# Patient Record
Sex: Female | Born: 1955 | Race: White | Hispanic: No | Marital: Married | Smoking: Never smoker
Health system: Southern US, Community
[De-identification: ages and names within clinical notes are randomized; demographics above are authoritative.]

## PROBLEM LIST (undated history)

## (undated) DIAGNOSIS — I10 Essential (primary) hypertension: Secondary | ICD-10-CM

## (undated) DIAGNOSIS — J449 Chronic obstructive pulmonary disease, unspecified: Secondary | ICD-10-CM

## (undated) HISTORY — PX: BREAST EXCISIONAL BIOPSY: SUR124

---

## 2004-09-05 ENCOUNTER — Ambulatory Visit: Payer: Self-pay | Admitting: Family Medicine

## 2005-04-01 ENCOUNTER — Ambulatory Visit: Payer: Self-pay

## 2008-08-28 ENCOUNTER — Ambulatory Visit: Payer: Self-pay | Admitting: Family Medicine

## 2008-10-25 ENCOUNTER — Ambulatory Visit: Payer: Self-pay

## 2009-07-28 ENCOUNTER — Emergency Department: Payer: Self-pay | Admitting: Unknown Physician Specialty

## 2011-11-25 HISTORY — PX: MENISCUS REPAIR: SHX5179

## 2012-11-24 HISTORY — PX: SHOULDER SURGERY: SHX246

## 2015-05-16 ENCOUNTER — Ambulatory Visit: Payer: Self-pay

## 2015-07-18 ENCOUNTER — Ambulatory Visit: Payer: Self-pay | Attending: Oncology

## 2015-11-14 ENCOUNTER — Ambulatory Visit: Payer: Self-pay

## 2015-11-21 ENCOUNTER — Ambulatory Visit
Admission: RE | Admit: 2015-11-21 | Discharge: 2015-11-21 | Disposition: A | Discharge status: Home / Self Care | Payer: Self-pay | Source: Ambulatory Visit | Attending: Oncology | Admitting: Oncology

## 2015-11-21 ENCOUNTER — Encounter: Payer: Self-pay | Admitting: *Deleted

## 2015-11-21 ENCOUNTER — Ambulatory Visit: Payer: Self-pay | Attending: Oncology | Admitting: *Deleted

## 2015-11-21 VITALS — BP 121/80 | HR 80 | Temp 97.8°F | Resp 18 | Ht 64.57 in | Wt 204.4 lb

## 2015-11-21 DIAGNOSIS — Z0000 Encounter for general adult medical examination without abnormal findings: Secondary | ICD-10-CM

## 2015-11-21 NOTE — Progress Notes (Signed)
Subjective:     Patient ID: Cindy Scott, female   DOB: 08-26-1956, 59 y.o.   MRN: [id number]  HPI   Review of Systems     Objective:   Physical Exam  Pulmonary/Chest: Right breast exhibits no inverted nipple, no mass, no nipple discharge, no skin change and no tenderness. Left breast exhibits no inverted nipple, no mass, no nipple discharge, no skin change and no tenderness. Breasts are symmetrical.  Abdominal: There is no splenomegaly or hepatomegaly.    Scar from c-section  Genitourinary: There is no rash, tenderness, lesion or injury on the right labia. There is no rash, tenderness, lesion or injury on the left labia. Cervix exhibits no motion tenderness, no discharge and no friability. Right adnexum displays no mass, no tenderness and no fullness. Left adnexum displays no mass, no tenderness and no fullness. No erythema, tenderness or bleeding in the vagina. No foreign body around the vagina. No signs of injury around the vagina. No vaginal discharge found.         Assessment:     59 year old White [facility], pap smear and mammogram.  Clinical breast exam unremarkable.  Taught self breast awareness.  Specimen collected for pap smear.  There is noted a very large red greater than 2 cm cervical polyp.  Patient refused rectal exam.  States she has had diarrhea and doesn't feel she could tolerated it.  Patient has been screened for eligibility.  She does not have any insurance, Medicare or Medicaid.  She also meets financial eligibility.  Hand-out given on the Affordable Care Act.     Plan:     Screening mammogram ordered.  Specimen sent to the lab.  Appointment scheduled to see Dr. Greggory Keenefrancesco for further evaluation of the cervical polyp on 12/20/15 @ 9:00.  Patient wants to wait, since she has knee surgery planned for 11/30/15.  Will follow-up per protocol.

## 2015-11-24 LAB — PAP LB AND HPV HIGH-RISK
HPV, HIGH-RISK: POSITIVE — AB
PAP Smear Comment: 0

## 2015-11-27 ENCOUNTER — Encounter: Payer: Self-pay | Admitting: *Deleted

## 2015-11-27 NOTE — Patient Instructions (Signed)
Human Papillomavirus Human papillomavirus (HPV) is the most common sexually transmitted infection (STI) and is highly contagious. HPV infections cause genital warts and cancers to the outlet of the womb (cervix), birth canal (vagina), opening of the birth canal (vulva), and anus. There are over 100 types of HPV. Unless wartlike lesions are present in the throat or there are genital warts that you can see or feel, HPV usually does not cause symptoms. It is possible to be infected for long periods and pass it on to others without knowing it. CAUSES  HPV is spread from person to person through sexual contact. This includes oral, vaginal, or anal sex. RISK FACTORS  Having unprotected sex. HPV can be spread by oral, vaginal, or anal sex.  Having several sex partners.  Having a sex partner who has other sex partners.  Having or having had another sexually transmitted infection. SIGNS AND SYMPTOMS  Most people carrying HPV do not have any symptoms. If symptoms are present, symptoms may include:  Wartlike lesions in the throat (from having oral sex).  Warts in the infected skin or mucous membranes.  Genital warts that may itch, burn, or bleed.  Genital warts that may be painful or bleed during sexual intercourse. DIAGNOSIS  If wartlike lesions are present in the throat or genital warts are present, your health care provider can usually diagnose HPV by physical examination.   Genital warts are easily seen with the naked eye.  Currently, there is no FDA-approved test to detect HPV in males.  In females, a Pap test can show cells that are infected with HPV.  In females, a scope can be used to view the cervix (colposcopy). A colposcopy can be performed if the pelvic exam or Pap test is abnormal. A sample of tissue may be removed (biopsy) during the colposcopy. TREATMENT  There is no treatment for the virus itself. However, there are treatments for the health problems and symptoms HPV can cause.  Your health care provider will follow you closely after you are treated. This is because the HPV can come back and may need treatment again. Treatment of HPV may include:   Medicines, which may be injected or applied in a cream, lotion, or gel form.  Use of a probe to apply extreme cold (cryotherapy).  Application of an intense beam of light (laser treatment).  Use of a probe to apply extreme heat (electrocautery).  Surgery. HOME CARE INSTRUCTIONS   Take medicines only as directed by your health care provider.  Use over-the-counter creams for itching or irritation as directed by your health care provider.  Keep all follow-up visits as directed by your health care provider. This is important.  Do not touch or scratch the warts.  Do not treat genital warts with medicines used for treating hand warts.  Do not have sex while you are being treated.  Do not douche or use tampons during treatment of HPV.  Tell your sex partner about your infection because he or she may also need treatment.  If you become pregnant, tell your health care provider that you have had HPV. Your health care provider will monitor you closely during pregnancy to be sure your baby is safe.  After treatment, use condoms during sex to prevent future infections.  Have only one sex partner.  Have a sex partner who does not have other sex partners. PREVENTION   Talk to your health care provider about getting the HPV vaccines. These vaccines prevent some HPV infections and cancers.   It is recommended that the vaccine be given to males and females between the ages of 9 and 26 years old. It will not work if you already have HPV, and it is not recommended for pregnant women.  A Pap test is done to screen for cervical cancer in women.  The first Pap test should be done at age 21 years.  Between ages 21 and 29 years, Pap tests are repeated every 2 years.  Beginning at age 30, you are advised to have a Pap test every  3 years as long as your past 3 Pap tests have been normal.  Some women have medical problems that increase the chance of getting cervical cancer. Talk to your health care provider about these problems. It is especially important to talk to your health care provider if a new problem develops soon after your last Pap test. In these cases, your health care provider may recommend more frequent screening and Pap tests.  The above recommendations are the same for women who have or have not gotten the vaccine for HPV.  If you had a hysterectomy for a problem that was not a cancer or a condition that could lead to cancer, then you no longer need Pap tests. However, even if you no longer need a Pap test, a regular exam is a good idea to make sure no other problems are starting.   If you are between the ages of 65 and 70 years and you have had normal Pap tests going back 10 years, you no longer need Pap tests. However, even if you no longer need a Pap test, a regular exam is a good idea to make sure no other problems are starting.  If you have had past treatment for cervical cancer or a condition that could lead to cancer, you need Pap tests and screening for cancer for at least 20 years after your treatment.  If Pap tests have been discontinued, risk factors (such as a new sexual partner)need to be reassessed to determine if screening should be resumed.  Some women may need screenings more often if they are at high risk for cervical cancer. SEEK MEDICAL CARE IF:   The treated skin becomes red, swollen, or painful.  You have a fever.  You feel generally ill.  You feel lumps or pimple-like projections in and around your genital area.  You develop bleeding of the vagina or the treatment area.  You have painful sexual intercourse. MAKE SURE YOU:   Understand these instructions.  Will watch your condition.  Will get help if you are not doing well or get worse.   This information is not  intended to replace advice given to you by your health care provider. Make sure you discuss any questions you have with your health care provider.   Document Released: 01/31/2004 Document Revised: 12/01/2014 Document Reviewed: 02/15/2014 Elsevier Interactive Patient Education 2016 Elsevier Inc.  

## 2015-12-20 ENCOUNTER — Encounter: Payer: Self-pay | Admitting: Obstetrics and Gynecology

## 2015-12-25 ENCOUNTER — Encounter: Payer: Self-pay | Admitting: *Deleted

## 2015-12-25 NOTE — Progress Notes (Signed)
Letter mailed to patient 11/27/15.  Follow up in one year with annual mammogram and next pap.  [facility] to [place].

## 2016-02-14 ENCOUNTER — Encounter: Payer: Self-pay | Admitting: Obstetrics and Gynecology

## 2016-03-18 ENCOUNTER — Encounter: Payer: Self-pay | Admitting: Obstetrics and Gynecology

## 2016-10-14 ENCOUNTER — Other Ambulatory Visit: Payer: Self-pay | Admitting: Specialist

## 2016-10-14 DIAGNOSIS — Z1231 Encounter for screening mammogram for malignant neoplasm of breast: Secondary | ICD-10-CM

## 2016-12-03 ENCOUNTER — Ambulatory Visit: Payer: Self-pay

## 2016-12-15 ENCOUNTER — Ambulatory Visit: Payer: Self-pay

## 2017-02-04 ENCOUNTER — Ambulatory Visit: Payer: Self-pay

## 2017-02-11 ENCOUNTER — Ambulatory Visit
Admission: RE | Admit: 2017-02-11 | Discharge: 2017-02-11 | Disposition: A | Discharge status: Home / Self Care | Payer: Self-pay | Source: Ambulatory Visit | Attending: Oncology | Admitting: Oncology

## 2017-02-11 ENCOUNTER — Ambulatory Visit: Payer: Self-pay | Attending: Oncology

## 2017-02-11 VITALS — BP 116/74 | HR 57 | Temp 98.4°F | Ht 63.0 in | Wt 185.6 lb

## 2017-02-11 DIAGNOSIS — Z0000 Encounter for general adult medical examination without abnormal findings: Secondary | ICD-10-CM

## 2017-02-11 NOTE — Progress Notes (Signed)
Subjective:     Patient ID: Cindy Scott, female   DOB: September 23, 1956, 61 y.o.   MRN: [id number]  HPI   Review of Systems     Objective:   Physical Exam  Pulmonary/Chest: Right breast exhibits no inverted nipple, no mass, no nipple discharge, no skin change and no tenderness. Left breast exhibits no inverted nipple, no mass, no nipple discharge, no skin change and no tenderness. Breasts are symmetrical.       Assessment:     61 year old patient [facility]  Patient screened, and meets [facility] eligibility.  Patient does not have insurance, Medicare or Medicaid.  Handout given on Affordable Care Act.  Instructed patient on breast self-exam using teach back method.  CBE unremarkable.  No mass or lump palpated.  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 She was referred to [facility] for cervical polyp that she states has been present greater than 10 years, but she cancelled her appointment with Dr. Greggory Keenefrancesco. States [facility], and wants to have pap/polyp removal at that time if possible due to anxiety.  Dr. Sonia SideSecord offered to see patient on 03/04/17 to evaluate polyp.     Plan:     Sent for bilateral screening mammogram.  Patient to call tomorrow if she wants to be scheduled for appointment polyp evaluation by Dr. Sonia SideSecord on 03/04/17.

## 2017-02-12 ENCOUNTER — Other Ambulatory Visit: Payer: Self-pay

## 2017-02-12 DIAGNOSIS — N63 Unspecified lump in unspecified breast: Secondary | ICD-10-CM

## 2017-02-12 NOTE — Progress Notes (Signed)
Patient called this morning, and declined appointment with Dr. Sonia SideSecord for 03/04/17.  States she will call back if needs to schedule future appointment for pap, and cervical polyp evaluation. Orders in for additional views.

## 2017-02-25 ENCOUNTER — Ambulatory Visit: Payer: Self-pay

## 2017-03-02 ENCOUNTER — Other Ambulatory Visit: Payer: Self-pay

## 2017-03-02 ENCOUNTER — Ambulatory Visit: Payer: Self-pay

## 2017-03-05 ENCOUNTER — Ambulatory Visit
Admission: RE | Admit: 2017-03-05 | Discharge: 2017-03-05 | Disposition: A | Discharge status: Home / Self Care | Payer: Self-pay | Source: Ambulatory Visit | Attending: Oncology | Admitting: Oncology

## 2017-03-05 DIAGNOSIS — N63 Unspecified lump in unspecified breast: Secondary | ICD-10-CM

## 2017-03-05 NOTE — Progress Notes (Signed)
Letter mailed from [facility] to notify of normal mammogram results.  Patient to return in one year for annual screening.  Copy to [facility]IS. 

## 2017-04-08 ENCOUNTER — Inpatient Hospital Stay: Payer: Self-pay

## 2017-04-22 ENCOUNTER — Telehealth: Payer: Self-pay

## 2017-04-22 ENCOUNTER — Inpatient Hospital Stay: Payer: Self-pay | Attending: Obstetrics and Gynecology

## 2017-04-22 NOTE — Telephone Encounter (Signed)
  Oncology Nurse Navigator Documentation Voicemail left with Ms. Adan. Notified that she can come to the clinic today even though she has missed her appointment time. Navigator Location: [facility] (04/22/17 0900)   )Navigator Encounter Type: Telephone (04/22/17 0900) Telephone: Kathrin Pennerutgoing Call;Appt Confirmation/Clarification (04/22/17 0900)                                                  Time Spent with Patient: 15 (04/22/17 0900)

## 2018-08-18 ENCOUNTER — Encounter: Payer: Self-pay | Admitting: *Deleted

## 2018-08-18 ENCOUNTER — Ambulatory Visit
Admission: RE | Admit: 2018-08-18 | Discharge: 2018-08-18 | Disposition: A | Discharge status: Home / Self Care | Payer: Self-pay | Source: Ambulatory Visit | Attending: Oncology | Admitting: Oncology

## 2018-08-18 ENCOUNTER — Other Ambulatory Visit: Payer: Self-pay | Admitting: Oncology

## 2018-08-18 ENCOUNTER — Encounter (INDEPENDENT_AMBULATORY_CARE_PROVIDER_SITE_OTHER): Payer: Self-pay

## 2018-08-18 ENCOUNTER — Other Ambulatory Visit: Payer: Self-pay

## 2018-08-18 ENCOUNTER — Ambulatory Visit: Payer: Self-pay | Attending: Oncology | Admitting: *Deleted

## 2018-08-18 VITALS — BP 107/75 | HR 69 | Temp 98.3°F | Ht 64.0 in | Wt 194.0 lb

## 2018-08-18 DIAGNOSIS — Z0000 Encounter for general adult medical examination without abnormal findings: Secondary | ICD-10-CM

## 2018-08-18 NOTE — Progress Notes (Signed)
  Subjective:     Patient ID: Cindy Scott, female   DOB: 1956/05/13, 62 y.o.   MRN: [id number]  HPI   Review of Systems     Objective:   Physical Exam  Pulmonary/Chest: Right breast exhibits no inverted nipple, no mass, no nipple discharge, no skin change and no tenderness. Left breast exhibits no inverted nipple, no mass, no nipple discharge, no skin change and no tenderness.  Abdominal: There is no splenomegaly or hepatomegaly.  Genitourinary: No labial fusion. There is no rash, tenderness, lesion or injury on the right labia. There is no rash, tenderness, lesion or injury on the left labia.         Assessment:     62 year old White female returns to [facility] for annual screening.  Clinical breast exam unremarkable.  Taught self breast awareness.  Specimen collected for pap smear without difficulty.  Very large approximate 2.5 cm cervical polyp noted on exam.  This polyp was noted on her 2016 exam and her 2018 exam, but patient did not follow-up with referral to GYN.  Discussed need to follow up with GYN again today.  Patient states she is agreeable.  Patient has been screened for eligibility.  She does not have any insurance, Medicare or Medicaid.  She also meets financial eligibility.  Hand-out given on the Affordable Care Act. Risk Assessment    Risk Scores      08/18/2018   Last edited by: Scarlett Presto, RN   5-year risk: 3.7 %   Lifetime risk: 16.8 %            Plan:     Screening mammogram ordered.  Specimen for pap sent to the lab.  Joellyn Quails to schedule patient a GYN appointment for further evaluation of the cervical polyp.  [place]  Christy to schedule appointment for at least 2 weeks out for pap results to be available and ease of getting patient back.  Will follow-up per [facility] protocol.

## 2018-08-25 LAB — PAP LB AND HPV HIGH-RISK
HPV, HIGH-RISK: NEGATIVE
PAP Smear Comment: 0

## 2018-08-30 ENCOUNTER — Encounter: Payer: Self-pay | Admitting: *Deleted

## 2018-08-30 NOTE — Progress Notes (Signed)
Left patient a message to return my call.  I have the results of her mammogram and pap smear.

## 2018-09-01 ENCOUNTER — Encounter: Payer: Self-pay | Admitting: Obstetrics and Gynecology

## 2018-09-01 ENCOUNTER — Telehealth: Payer: Self-pay | Admitting: Obstetrics and Gynecology

## 2018-09-01 ENCOUNTER — Ambulatory Visit (INDEPENDENT_AMBULATORY_CARE_PROVIDER_SITE_OTHER): Payer: PRIVATE HEALTH INSURANCE | Admitting: Obstetrics and Gynecology

## 2018-09-01 ENCOUNTER — Other Ambulatory Visit (HOSPITAL_COMMUNITY)
Admission: RE | Admit: 2018-09-01 | Discharge: 2018-09-01 | Disposition: A | Discharge status: Home / Self Care | Payer: Self-pay | Source: Ambulatory Visit | Attending: Obstetrics and Gynecology | Admitting: Obstetrics and Gynecology

## 2018-09-01 VITALS — BP 130/82 | HR 52 | Ht 64.0 in | Wt 192.0 lb

## 2018-09-01 DIAGNOSIS — N841 Polyp of cervix uteri: Secondary | ICD-10-CM

## 2018-09-01 NOTE — Progress Notes (Signed)
Pt presents today with cervical polyp. Pt is doing well.

## 2018-09-01 NOTE — Progress Notes (Signed)
HPI:      [name] is a 62 y.o. No obstetric history on file. who LMP was Patient's last menstrual period was 11/25/2004.  Subjective:   She presents today referred from [facility] for possible endocervical polyp.  Denies any issues with vaginal bleeding.  She states that it has been noticed and been there for years but has been "afraid to do anything about it".    Hx: The following portions of the patient's history were reviewed and updated as appropriate:             She  has no past medical history on file. She does not have a problem list on file. She  has a past surgical history that includes Breast excisional biopsy (Right, 45+ yrs ago). Her family history includes Breast cancer (age of onset: 22) in her mother. She  has no tobacco, alcohol, and drug history on file. She has a current medication list which includes the following prescription(s): esomeprazole, fluoxetine, gabapentin, pramipexole, and tiotropium. She has No Known Allergies.       Review of Systems:  Review of Systems  Constitutional: Denied constitutional symptoms, night sweats, recent illness, fatigue, fever, insomnia and weight loss.  Eyes: Denied eye symptoms, eye pain, photophobia, vision change and visual disturbance.  Ears/Nose/Throat/Neck: Denied ear, nose, throat or neck symptoms, hearing loss, nasal discharge, sinus congestion and sore throat.  Cardiovascular: Denied cardiovascular symptoms, arrhythmia, chest pain/pressure, edema, exercise intolerance, orthopnea and palpitations.  Respiratory: Denied pulmonary symptoms, asthma, pleuritic pain, productive sputum, cough, dyspnea and wheezing.  Gastrointestinal: Denied, gastro-esophageal reflux, melena, nausea and vomiting.  Genitourinary: See HPI for additional information.  Musculoskeletal: Denied musculoskeletal symptoms, stiffness, swelling, muscle weakness and myalgia.  Dermatologic: Denied dermatology symptoms, rash and scar.  Neurologic: Denied  neurology symptoms, dizziness, headache, neck pain and syncope.  Psychiatric: Denied psychiatric symptoms, anxiety and depression.  Endocrine: Denied endocrine symptoms including hot flashes and night sweats.   Meds:   Current Outpatient Medications on File Prior to Visit  Medication Sig Dispense Refill  . esomeprazole (NEXIUM) 10 MG packet Take 10 mg by mouth daily before breakfast.    . FLUoxetine (PROZAC) 40 MG capsule Take 40 mg by mouth 2 (two) times daily.    Marland Kitchen gabapentin (NEURONTIN) 400 MG capsule Take 400 mg by mouth 2 (two) times daily.    . pramipexole (MIRAPEX) 0.25 MG tablet Take 0.25 mg by mouth 3 (three) times daily.    Marland Kitchen tiotropium (SPIRIVA) 18 MCG inhalation capsule Place 18 mcg into inhaler and inhale daily.     No current facility-administered medications on file prior to visit.     Objective:     Vitals:   09/01/18 1104  BP: 130/82  Pulse: (!) 52              Physical examination   Pelvic:   Vulva: Normal appearance.  No lesions.  Vagina: No lesions or abnormalities noted.  Support: Normal pelvic support.  Urethra No masses tenderness or scarring.  Meatus Normal size without lesions or prolapse.  Cervix: Normal appearance.  No lesions.  Polyp noted  Anus: Normal exam.  No lesions.  Perineum: Normal exam.  No lesions.        Bimanual   Uterus: Normal size.  Non-tender.  Mobile.  AV.  Adnexae: No masses.  Non-tender to palpation.  Cul-de-sac: Negative for abnormality.   Endocervical polypectomy Endocervical polyp present.  Polypectomy performed without problem.  Hemostatis noted or obtained with [facility]'  solution.    Assessment:    No obstetric history on file. There are no active problems to display for this patient.    1. Endocervical polyp     Likely benign   Plan:            1.  Polyp removed and sent to pathology.  Expect benign results.  Patient to follow-up at [facility] for routine care.  If there are any issues with the polyp we will  contact her regarding follow-up here.  Orders No orders of the defined types were placed in this encounter.   No orders of the defined types were placed in this encounter.     F/U  No follow-ups on file. I spent 22 minutes involved in the care of this patient of which greater than 50% was spent discussing medical condition, previous Pap smears, vaginal bleeding, endocervical polyp causes and management, future follow-ups.  All questions answered.  Elonda Husky, M.D. 09/01/2018 11:13 AM

## 2018-09-01 NOTE — Addendum Note (Signed)
Addended by: Girard Cooter M on: 09/01/2018 11:56 AM   Modules accepted: Orders

## 2018-09-01 NOTE — Telephone Encounter (Signed)
The patient is asking if the cream script is going to be sent to [facility] today for the rash or does she need to come get samples?  She is on line 1 now, but she can be reached at her cell number as well, please advise, thanks

## 2018-09-02 NOTE — Telephone Encounter (Signed)
Advised pt that she could get antifungal cream over the counter. Pt understood.

## 2018-09-08 ENCOUNTER — Encounter: Payer: Self-pay | Admitting: Obstetrics and Gynecology

## 2018-09-21 ENCOUNTER — Encounter: Payer: Self-pay | Admitting: *Deleted

## 2018-09-21 NOTE — Progress Notes (Signed)
Letter mailed to inform patient of her normal mammogram and pap results.  She is to follow up in 1 year with annual screening mammo and pap in 3 years.  [facility] to [place].

## 2019-08-19 ENCOUNTER — Telehealth: Payer: Self-pay

## 2019-08-19 NOTE — Telephone Encounter (Signed)
Pre-visit screening call made prior to appointment on 08/23/2019 with [facility]. Pt would like to reschedule this appointment. Care team notified.

## 2019-08-22 ENCOUNTER — Telehealth: Payer: Self-pay | Admitting: *Deleted

## 2019-08-23 ENCOUNTER — Ambulatory Visit: Payer: Self-pay

## 2020-01-31 ENCOUNTER — Ambulatory Visit: Payer: Self-pay | Attending: Internal Medicine

## 2020-01-31 DIAGNOSIS — Z23 Encounter for immunization: Secondary | ICD-10-CM | POA: Insufficient documentation

## 2020-01-31 NOTE — Progress Notes (Signed)
   Covid-19 Vaccination Clinic  Name:  Cindy Scott    MRN: [id number] DOB: 09/07/1956  01/31/2020  Cindy Scott was observed post Covid-19 immunization for 15 minutes without incident. She was provided with Vaccine Information Sheet and instruction to access the V-Safe system.   Cindy Scott was instructed to call 911 with any severe reactions post vaccine: Marland Kitchen Difficulty breathing  . Swelling of face and throat  . A fast heartbeat  . A bad rash all over body  . Dizziness and weakness   Immunizations Administered    Name Date Dose VIS Date Route   Pfizer COVID-19 Vaccine 01/31/2020  4:46 PM 0.3 mL 11/04/2019 Intramuscular   Manufacturer: [employer], [employer]   Lot: [id number]   NDC: [address]-7

## 2020-02-21 ENCOUNTER — Ambulatory Visit: Payer: Self-pay | Attending: Internal Medicine

## 2020-02-21 DIAGNOSIS — Z23 Encounter for immunization: Secondary | ICD-10-CM

## 2020-02-21 NOTE — Progress Notes (Signed)
   Covid-19 Vaccination Clinic  Name:  Cindy Scott    MRN: [id number] DOB: 12-17-1955  02/21/2020  [name] was observed post Covid-19 immunization for 15 minutes without incident. She was provided with Vaccine Information Sheet and instruction to access the V-Safe system.   [name] was instructed to call 911 with any severe reactions post vaccine: Marland Kitchen Difficulty breathing  . Swelling of face and throat  . A fast heartbeat  . A bad rash all over body  . Dizziness and weakness   Immunizations Administered    Name Date Dose VIS Date Route   Pfizer COVID-19 Vaccine 02/21/2020  1:22 PM 0.3 mL 11/04/2019 Intramuscular   Manufacturer: [employer], [employer]   Lot: [id number]   NDC: [address]-5

## 2020-05-15 ENCOUNTER — Ambulatory Visit
Admission: RE | Admit: 2020-05-15 | Discharge: 2020-05-15 | Disposition: A | Discharge status: Home / Self Care | Payer: Medicaid Other | Source: Ambulatory Visit | Attending: Oncology | Admitting: Oncology

## 2020-05-15 ENCOUNTER — Ambulatory Visit: Payer: Self-pay | Attending: Oncology | Admitting: *Deleted

## 2020-05-15 ENCOUNTER — Other Ambulatory Visit: Payer: Self-pay

## 2020-05-15 ENCOUNTER — Encounter: Payer: Self-pay | Admitting: *Deleted

## 2020-05-15 VITALS — BP 138/83 | HR 73 | Temp 97.3°F | Resp 20 | Ht 63.0 in | Wt 215.5 lb

## 2020-05-15 DIAGNOSIS — Z0000 Encounter for general adult medical examination without abnormal findings: Secondary | ICD-10-CM

## 2020-05-15 DIAGNOSIS — Z1231 Encounter for screening mammogram for malignant neoplasm of breast: Secondary | ICD-10-CM | POA: Insufficient documentation

## 2020-05-15 NOTE — Patient Instructions (Signed)
Gave patient hand-out, Women Staying Healthy, Active and Well from [place], with education on breast health, pap smears, heart and colon health. HPV Test Why am I having this test? HPV (human papillomavirus) refers to a group of about 100 viruses. Many of these viruses cause growths on, in, or around the genitals. Most HPV viruses cause infections that usually go away without treatment.  The HPV test checks for high-risk types (strains) of HPV. Strains 16 and 18 are considered the most high-risk for cancer. If you have strain 16 or 18 HPV and it is not treated, it can increase your risk for cancer of the cervix, vagina, vulva, or anus. HPV can be found in both males and females. However, the HPV test is used to screen for increased cancer risk in females:  Who are 22-40 years old.  Who have an abnormal Pap test.  Who have been treated for an abnormal Pap test in the past.  Who have been treated for a high-risk HPV infection in the past. If you are a woman older than 30, you may have the HPV test at the same time as a pelvic exam and Pap test. What is being tested? This test checks for the DNA (genetic) strands of the HPV infection. This test is also called the HPV DNA test. What kind of sample is taken?  This test requires a sample of cells from the cervix. This will be done using a small cotton swab, plastic spatula, or brush. This sample is often collected during a pelvic exam, when you are lying on your back on an exam table with feet in footrests (stirrups). How do I prepare for this test?  Starting 24-48 hours before your test, or as told by your health care provider, do not: ? Take a bath. ? Have sex. ? Douche.  Schedule the test for a day when you are not menstruating. If you are menstruating on the day of the test, you may need to reschedule.  You will be asked to urinate right before the test. How are the results reported? Your test results will be reported as either positive or  negative for HPV. If you have a positive result, the results will indicate which HPV strain you are positive for. What do the results mean? A negative HPV test result means that no HPV was found. This means it is very likely that you do not have HPV. A positive HPV test result means that you have HPV.  If your results show the presence of any high-risk strains, you may have a higher risk of developing anal or cervical cancer if your infection is not treated.  If any low-risk HPV strains are found, it is not likely that you have an increased risk for cancer. Talk with your health care provider about what your results mean. Questions to ask your health care provider Ask your health care provider, or the department that is doing the test:  When will my results be ready?  How will I get my results?  What are my treatment options?  What other tests do I need?  What are my next steps? Summary  The human papillomavirus (HPV) test is used to look for high-risk types of HPV infection. This test is done only for females.  HPV types 16 and 18 are considered high-risk types of HPV. If untreated, these types of infections increase your risk for cancer of the cervix or anus.  A negative HPV test result means that no HPV  was found, and it is very likely that you do not have HPV.  A positive HPV test result means that you have an HPV infection. This information is not intended to replace advice given to you by your health care provider. Make sure you discuss any questions you have with your health care provider. Document Revised: 10/23/2017 Document Reviewed: 09/22/2017 Elsevier Patient Education  2020 [employer].

## 2020-05-15 NOTE — Progress Notes (Signed)
  Subjective:     Patient ID: Cindy Scott, female   DOB: October 29, 1956, 64 y.o.   MRN: [id number]  HPI   Review of Systems     Objective:   Physical Exam Chest:     Breasts:        Right: No swelling, bleeding, inverted nipple, mass, nipple discharge, skin change or tenderness.        Left: No swelling, bleeding, inverted nipple, mass, nipple discharge, skin change or tenderness.    Lymphadenopathy:     Upper Body:     Right upper body: No supraclavicular or axillary adenopathy.     Left upper body: No supraclavicular or axillary adenopathy.        Assessment:     64 year old White female returns to [facility] for clinical breast exam and mammogram.  Clinical breast exam unremarkable.  Taught self breast awareness.  Last pap on 08/18/18 was ASCUS / HPV negative.  Next pap due in 2022.  Reviewed protocol with patient.  Patient has been screened for eligibility.  She does not have any insurance, Medicare or Medicaid.  She also meets financial eligibility.   Risk Assessment    Risk Scores      05/15/2020 08/18/2018   Last edited by: Alta Corning, CMA Scarlett Presto, RN   5-year risk: 3.9 % 3.7 %   Lifetime risk: 15.8 % 16.8 %            Plan:     Screening mammogram ordered.  Will follow up per [facility] protocol.

## 2020-05-17 ENCOUNTER — Encounter: Payer: Self-pay | Admitting: *Deleted

## 2020-05-17 NOTE — Progress Notes (Signed)
Letter mailed from the [facility] to inform patient of her normal mammogram results.  Patient is to follow-up with annual screening in one year. 

## 2021-12-25 ENCOUNTER — Other Ambulatory Visit: Payer: Self-pay | Admitting: Specialist

## 2021-12-25 DIAGNOSIS — Z1231 Encounter for screening mammogram for malignant neoplasm of breast: Secondary | ICD-10-CM

## 2022-03-24 ENCOUNTER — Ambulatory Visit: Payer: Medicare Other | Attending: Otolaryngology | Admitting: Speech Pathology

## 2022-03-25 ENCOUNTER — Ambulatory Visit: Payer: Medicare Other | Admitting: Speech Pathology

## 2022-03-27 ENCOUNTER — Ambulatory Visit: Payer: Medicare Other | Admitting: Speech Pathology

## 2022-04-01 ENCOUNTER — Ambulatory Visit: Payer: Medicare Other | Admitting: Speech Pathology

## 2022-04-03 ENCOUNTER — Ambulatory Visit: Payer: Medicare Other | Admitting: Speech Pathology

## 2022-04-07 ENCOUNTER — Ambulatory Visit
Admission: RE | Admit: 2022-04-07 | Discharge: 2022-04-07 | Disposition: A | Discharge status: Home / Self Care | Payer: Medicare Other | Source: Ambulatory Visit | Attending: Specialist | Admitting: Specialist

## 2022-04-07 DIAGNOSIS — Z1231 Encounter for screening mammogram for malignant neoplasm of breast: Secondary | ICD-10-CM | POA: Insufficient documentation

## 2022-04-08 ENCOUNTER — Ambulatory Visit: Payer: Medicare Other | Admitting: Speech Pathology

## 2022-04-10 ENCOUNTER — Ambulatory Visit: Payer: Medicare Other | Admitting: Speech Pathology

## 2022-04-15 ENCOUNTER — Ambulatory Visit: Payer: Medicare Other | Admitting: Speech Pathology

## 2022-04-17 ENCOUNTER — Ambulatory Visit: Payer: Medicare Other | Admitting: Speech Pathology

## 2022-04-22 ENCOUNTER — Ambulatory Visit: Payer: Medicare Other | Admitting: Speech Pathology

## 2022-04-24 ENCOUNTER — Ambulatory Visit: Payer: Medicare Other | Admitting: Speech Pathology

## 2022-04-29 ENCOUNTER — Ambulatory Visit: Payer: Medicare Other | Admitting: Speech Pathology

## 2022-05-01 ENCOUNTER — Ambulatory Visit: Payer: Medicare Other | Admitting: Speech Pathology

## 2022-05-06 ENCOUNTER — Ambulatory Visit: Payer: Medicare Other | Admitting: Speech Pathology

## 2022-05-08 ENCOUNTER — Ambulatory Visit: Payer: Medicare Other | Admitting: Speech Pathology

## 2022-05-13 ENCOUNTER — Ambulatory Visit: Payer: Medicare Other | Admitting: Speech Pathology

## 2022-05-15 ENCOUNTER — Ambulatory Visit: Payer: Medicare Other | Admitting: Speech Pathology

## 2022-05-20 ENCOUNTER — Ambulatory Visit: Payer: Medicare Other | Admitting: Speech Pathology

## 2022-05-21 IMAGING — MG DIGITAL SCREENING BILAT W/ TOMO W/ CAD
8 series · 8 of 24 positions shown · non-contrast
Comparison: Previous exam(s).

CLINICAL DATA: Screening.

EXAM:
DIGITAL SCREENING BILATERAL MAMMOGRAM WITH TOMO AND CAD

[L MLO synth-2D]
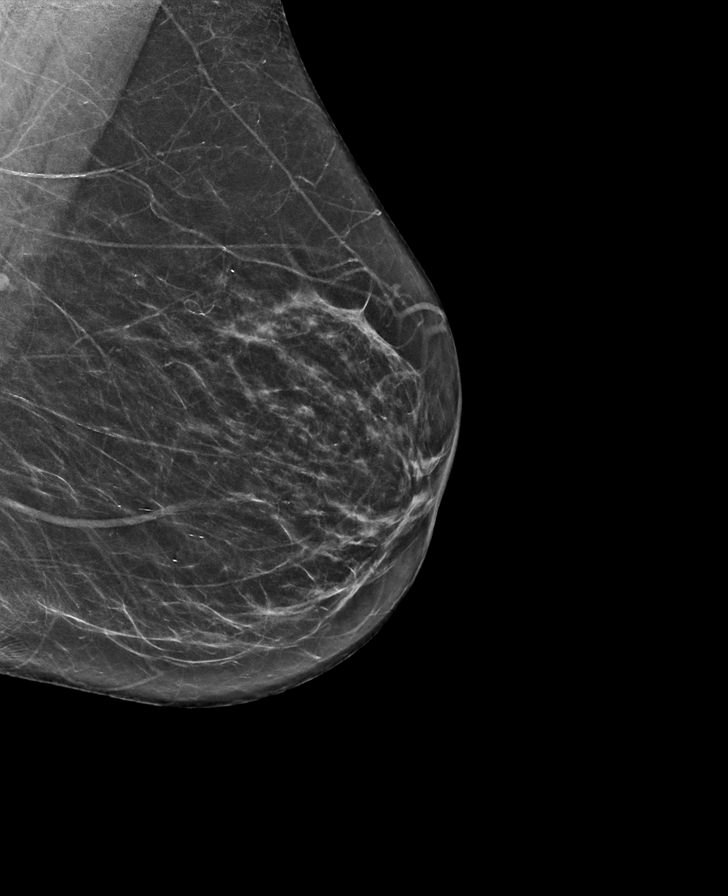

[L CC synth-2D]
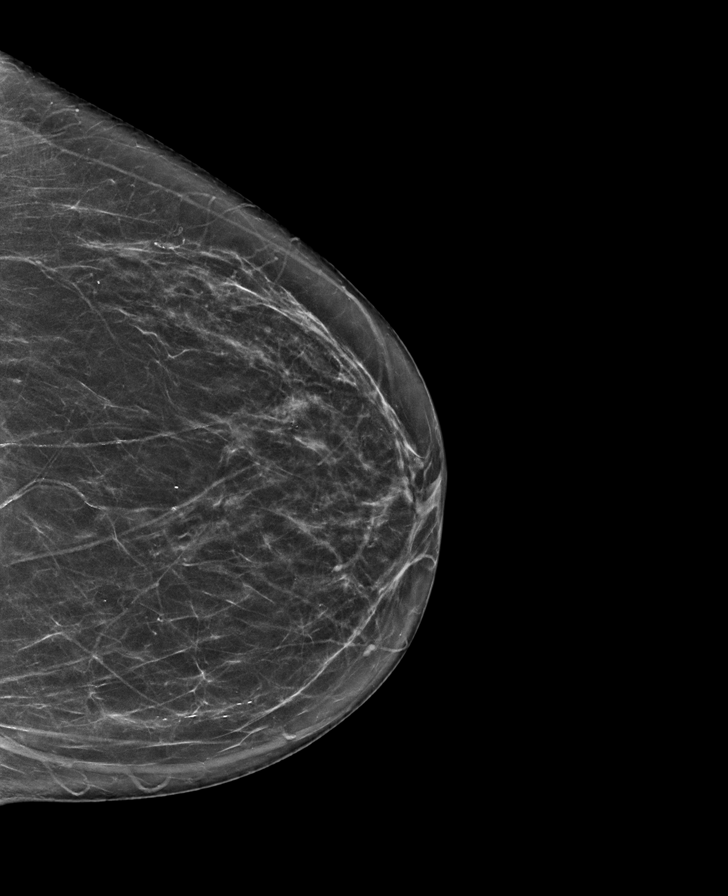

[R MLO synth-2D]
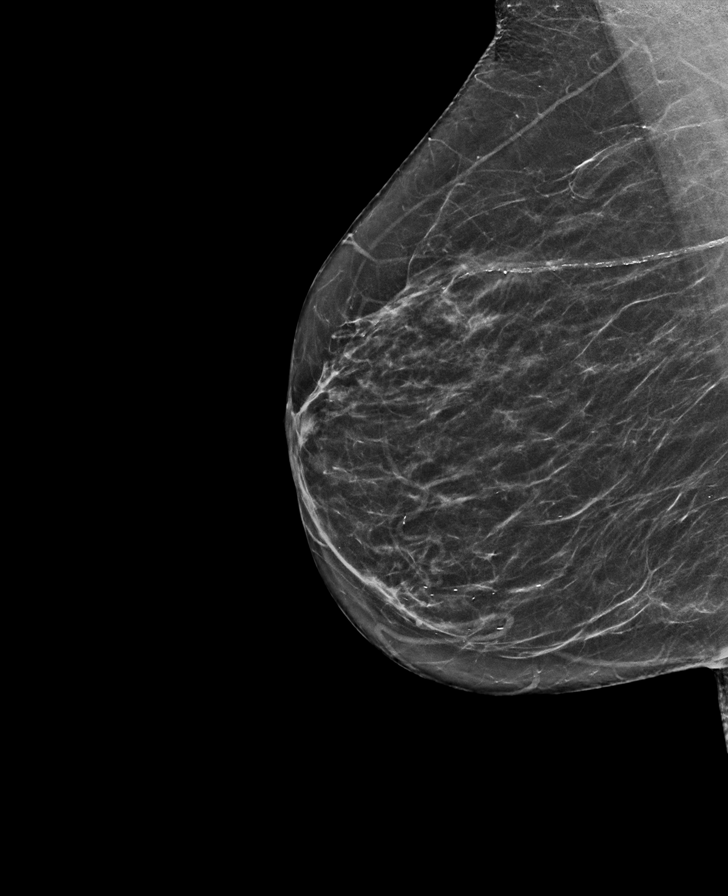

[R CC synth-2D]
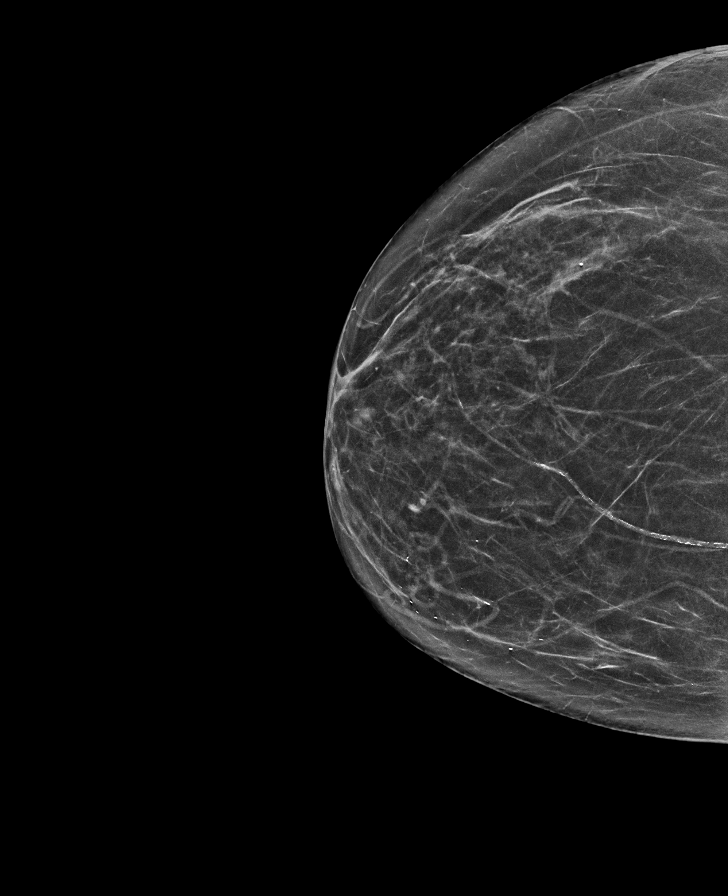

[R MLO tomo · tomo slice 33/65.0]
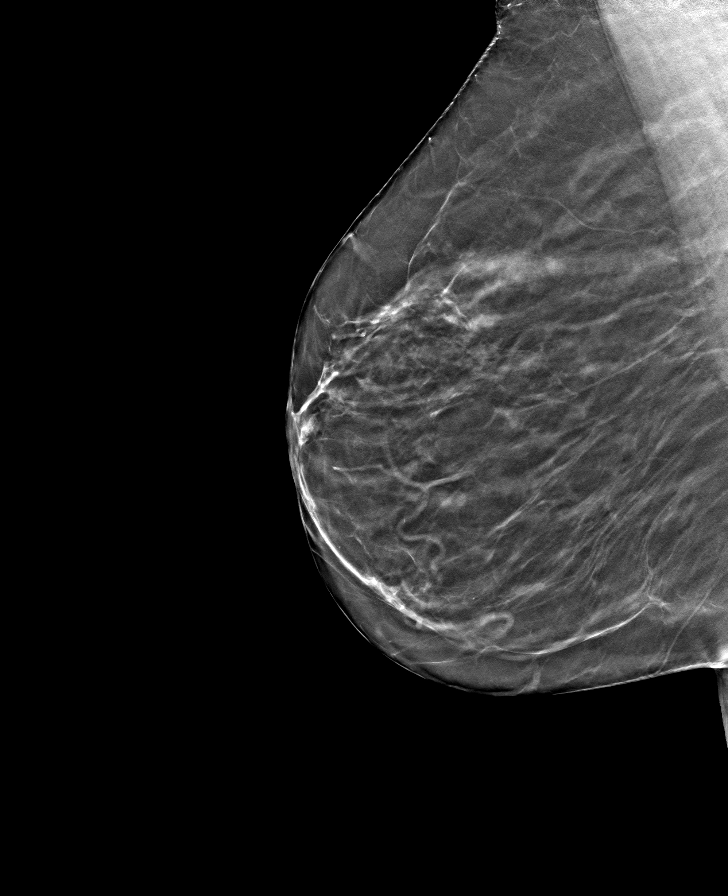

[R CC tomo · tomo slice 31/62.0]
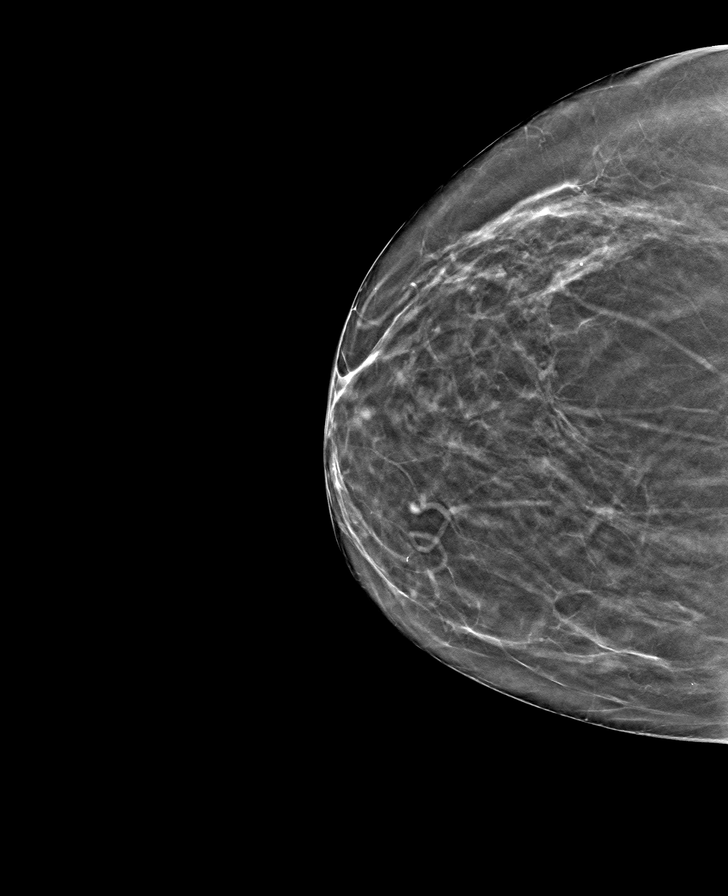

[L MLO tomo · tomo slice 33/66.0]
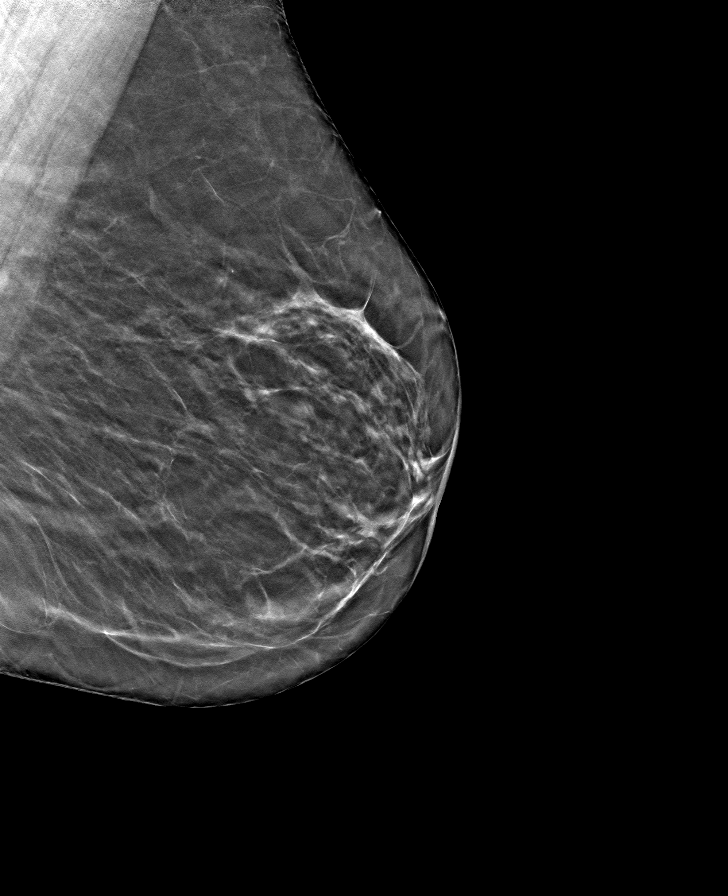

[L CC tomo · tomo slice 34/67.0]
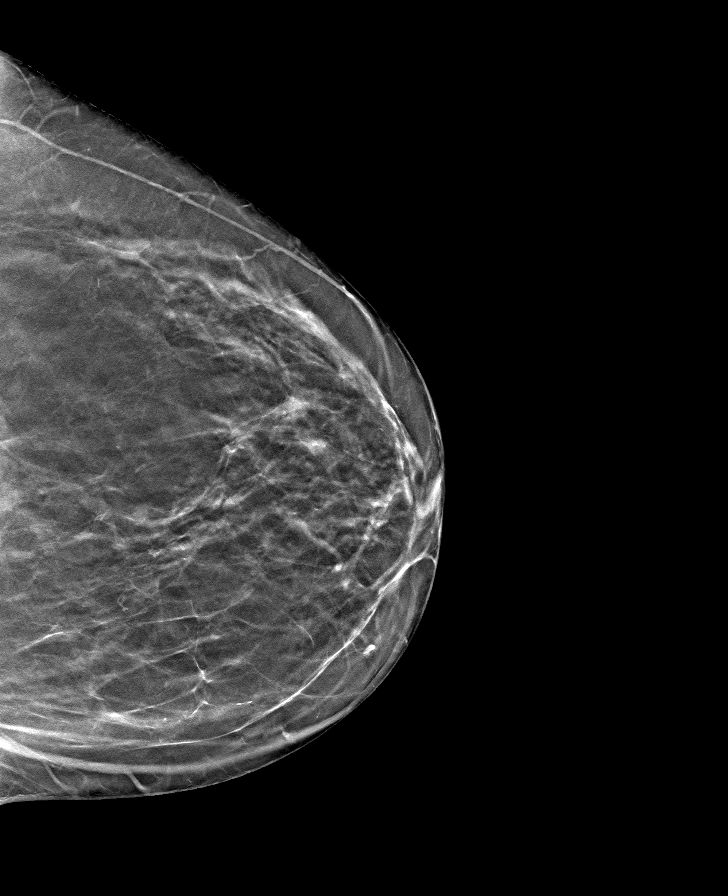

[8 of 24 positions shown; findings below may reference images not displayed]

ACR Breast Density Category b: There are scattered areas of
fibroglandular density.
FINDINGS: There are no findings suspicious for malignancy. Images were
processed with CAD.
IMPRESSION: No mammographic evidence of malignancy. A result letter of this
screening mammogram will be mailed directly to the patient.

RECOMMENDATION:
Screening mammogram in one year. (Code:[id number]-775)

BI-RADS CATEGORY  1: Negative.

## 2022-05-22 ENCOUNTER — Ambulatory Visit: Payer: Medicare Other | Admitting: Speech Pathology

## 2022-05-26 ENCOUNTER — Ambulatory Visit: Payer: Medicare Other | Admitting: Speech Pathology

## 2022-05-29 ENCOUNTER — Ambulatory Visit: Payer: Medicare Other | Admitting: Speech Pathology

## 2022-06-03 ENCOUNTER — Ambulatory Visit: Payer: Medicare Other | Admitting: Speech Pathology

## 2022-06-05 ENCOUNTER — Ambulatory Visit: Payer: Medicare Other | Admitting: Speech Pathology

## 2022-06-10 ENCOUNTER — Ambulatory Visit: Payer: Medicare Other | Admitting: Speech Pathology

## 2022-06-12 ENCOUNTER — Ambulatory Visit: Payer: Medicare Other | Admitting: Speech Pathology

## 2022-06-17 ENCOUNTER — Ambulatory Visit: Payer: Medicare Other | Admitting: Speech Pathology

## 2022-06-19 ENCOUNTER — Ambulatory Visit: Payer: Medicare Other | Admitting: Speech Pathology

## 2022-06-24 ENCOUNTER — Ambulatory Visit: Payer: Medicare Other | Admitting: Speech Pathology

## 2022-06-26 ENCOUNTER — Ambulatory Visit: Payer: Medicare Other | Admitting: Speech Pathology

## 2024-03-25 ENCOUNTER — Ambulatory Visit (INDEPENDENT_AMBULATORY_CARE_PROVIDER_SITE_OTHER)

## 2024-03-25 ENCOUNTER — Ambulatory Visit
Admission: RE | Admit: 2024-03-25 | Discharge: 2024-03-25 | Disposition: A | Discharge status: Home / Self Care | Source: Ambulatory Visit

## 2024-03-25 VITALS — BP 137/84 | HR 61 | Temp 98.4°F | Resp 18

## 2024-03-25 DIAGNOSIS — R051 Acute cough: Secondary | ICD-10-CM | POA: Diagnosis not present

## 2024-03-25 DIAGNOSIS — J209 Acute bronchitis, unspecified: Secondary | ICD-10-CM | POA: Diagnosis not present

## 2024-03-25 DIAGNOSIS — R059 Cough, unspecified: Secondary | ICD-10-CM | POA: Insufficient documentation

## 2024-03-25 HISTORY — DX: Essential (primary) hypertension: I10

## 2024-03-25 HISTORY — DX: Chronic obstructive pulmonary disease, unspecified: J44.9

## 2024-03-25 MED ORDER — DOXYCYCLINE HYCLATE 100 MG PO CAPS: 100.0000 mg | ORAL_CAPSULE | Freq: Two times a day (BID) | ORAL | 0 refills | Status: DC

## 2024-03-25 MED ORDER — ALBUTEROL SULFATE HFA 108 (90 BASE) MCG/ACT IN AERS
2.0000 | INHALATION_SPRAY | Freq: Four times a day (QID) | RESPIRATORY_TRACT | 2 refills | Status: AC | PRN
Start: 2024-03-25 — End: ?

## 2024-03-25 MED ORDER — AMOXICILLIN-POT CLAVULANATE 875-125 MG PO TABS: 1.0000 | ORAL_TABLET | Freq: Two times a day (BID) | ORAL | 0 refills | Status: AC

## 2024-03-25 MED ORDER — PREDNISONE 50 MG PO TABS: ORAL_TABLET | ORAL | 0 refills | Status: AC

## 2024-03-25 NOTE — ED Provider Notes (Signed)
 Cindy Scott    CSN: [id number] Arrival date & time: 03/25/24  1423      History   Chief Complaint Chief Complaint  Patient presents with   Generalized Body Aches    Something respiratory - Entered by patient    HPI Cindy Scott is a 68 y.o. female.   Patient complains of shortness of breath and cough.  Patient reports that the symptoms began 7 days ago and have progressively gotten worse.  Patient denies any current fever or chills.  Patient has a past medical history of COPD.  Patient is out of her albuterol inhaler.  Patient denies any fever or chills today.  Patient was a former smoker.  She has not smoked in over 30 years.  Patient denies any chest pain she denies any abdominal pain no nausea vomiting or diarrhea.  Patient denies exposure to flu or COVID  The history is provided by the patient. No language interpreter was used.    Past Medical History:  Diagnosis Date   COPD (chronic obstructive pulmonary disease) (HCC)    Hypertension     Patient Active Problem List   Diagnosis Date Noted   Cough 03/25/2024    Past Surgical History:  Procedure Laterality Date   BREAST EXCISIONAL BIOPSY Right 45+ yrs ago   NEG   CESAREAN SECTION  1978, 1985, 1989, 1996   MENISCUS REPAIR  2013   SHOULDER SURGERY  2014    OB History     Gravida  4   Para  0   Term  0   Preterm  0   AB  0   Living  3      SAB  0   IAB  0   Ectopic  0   Multiple  0   Live Births  0            Home Medications    Prior to Admission medications   Medication Sig Start Date End Date Taking? Authorizing Provider  albuterol (VENTOLIN HFA) 108 (90 Base) MCG/ACT inhaler Inhale 2 puffs into the lungs every 6 (six) hours as needed for wheezing or shortness of breath. 03/25/24  Yes Erminie Foulks K, PA-C  amoxicillin-clavulanate (AUGMENTIN) 875-125 MG tablet Take 1 tablet by mouth 2 (two) times daily. 03/25/24  Yes Kenedie Dirocco K, PA-C  atorvastatin (LIPITOR) 40 MG tablet  Take 40 mg by mouth. 08/22/21 03/07/25 Yes [provider]  citalopram (CELEXA) 20 MG tablet Take 1 tablet by mouth daily. 08/22/21 09/13/24 Yes [provider]  omeprazole (PRILOSEC) 40 MG capsule Take 1 capsule by mouth daily. 08/05/22  Yes [provider]  predniSONE (DELTASONE) 50 MG tablet One tablet a day for 5 days 03/25/24  Yes Alvah Gilder K, PA-C  gabapentin (NEURONTIN) 400 MG capsule Take 400 mg by mouth 2 (two) times daily.    [provider]  primidone (MYSOLINE) 50 MG tablet Take by mouth.    [provider]    Family History Family History  Problem Relation Age of Onset   Breast cancer Mother 76    Social History Social History   Tobacco Use   Smoking status: Never   Smokeless tobacco: Never   Tobacco comments:    Former smoker - Quit 30 years ago  [employer]vertising account pl[employer]   Vaping status: Never Used  Substance Use Topics   Alcohol use: Not Currently    Alcohol/week: 0.0 standard drinks of alcohol   Drug use: Never  Allergies   Patient has no known allergies.   Review of Systems Review of Systems  All other systems reviewed and are negative.    Physical Exam Triage Vital Signs ED Triage Vitals  Encounter Vitals Group     BP 03/25/24 1450 137/84     Systolic BP Percentile --      Diastolic BP Percentile --      Pulse Rate 03/25/24 1450 61     Resp 03/25/24 1450 18     Temp 03/25/24 1450 98.4 F (36.9 C)     Temp src --      SpO2 03/25/24 1450 92 %     Weight --      Height --      Head Circumference --      Peak Flow --      Pain Score 03/25/24 1445 3     Pain Loc --      Pain Education --      Exclude from Growth Chart --    No data found.  Updated Vital Signs BP 137/84   Pulse 61   Temp 98.4 F (36.9 C)   Resp 18   LMP 11/25/2004   SpO2 92%   Visual Acuity Right Eye Distance:   Left Eye Distance:   Bilateral Distance:    Right Eye Near:   Left Eye Near:    Bilateral Near:     Physical  Exam Vitals and nursing note reviewed.  Constitutional:      Appearance: She is well-developed.  HENT:     Head: Normocephalic.     Nose: Nose normal.     Mouth/Throat:     Mouth: Mucous membranes are moist.  Cardiovascular:     Rate and Rhythm: Normal rate.  Pulmonary:     Breath sounds: Wheezing and rhonchi present.  Abdominal:     General: There is no distension.  Musculoskeletal:        General: Normal range of motion.     Cervical back: Normal range of motion.  Skin:    General: Skin is warm.  Neurological:     General: No focal deficit present.     Mental Status: She is alert and oriented to person, place, and time.  Psychiatric:        Mood and Affect: Mood normal.      UC Treatments / Results  Labs (all labs ordered are listed, but only abnormal results are displayed) Labs Reviewed - No data to display  EKG   Radiology DG Chest 2 View Result Date: 03/25/2024 CLINICAL DATA:  Cough EXAM: CHEST - 2 VIEW COMPARISON:  X-ray 07/28/2009 FINDINGS: Hyperinflation. Eventration of the right hemidiaphragm. No consolidation, pneumothorax or effusion. No edema. Normal cardiopericardial silhouette. Calcified aorta. Degenerative changes along the spine. Fixation hardware along the lower cervical spine. Right shoulder reverse arthroplasty. Surgical clips in the right upper quadrant. IMPRESSION: No acute cardiopulmonary disease. Electronically Signed   By: Adrianna Horde M.D.   On: 03/25/2024 15:30    Procedures Procedures (including critical care time)  Medications Ordered in UC Medications - No data to display  Initial Impression / Assessment and Plan / UC Course  I have reviewed the triage vital signs and the nursing notes.  Pertinent labs & imaging results that were available during my care of the patient were reviewed by me and considered in my medical decision making (see chart for details).     Chest x-ray shows no evidence of pneumonia.  Patient counseled on results  she is given a prescription for Augmentin, albuterol and prednisone.  Patient advised to see her physician for recheck next week.  Patient advised to go to the emergency department if symptoms worsen or change. Final Clinical Impressions(s) / UC Diagnoses   Final diagnoses:  Acute cough  Acute bronchitis, unspecified organism     Discharge Instructions      Follow up with your Physician for recheck.  Return if any problems.    ED Prescriptions     Medication Sig Dispense Auth. Provider   doxycycline (VIBRAMYCIN) 100 MG capsule  (Status: Discontinued) Take 1 capsule (100 mg total) by mouth 2 (two) times daily. 20 capsule Reginald Weida K, PA-C   albuterol (VENTOLIN HFA) 108 (90 Base) MCG/ACT inhaler Inhale 2 puffs into the lungs every 6 (six) hours as needed for wheezing or shortness of breath. 8 g Amilia Vandenbrink K, PA-C   predniSONE (DELTASONE) 50 MG tablet One tablet a day for 5 days 5 tablet Author Hatlestad K, PA-C   amoxicillin-clavulanate (AUGMENTIN) 875-125 MG tablet Take 1 tablet by mouth 2 (two) times daily. 20 tablet Dhani Imel K, PA-C      PDMP not reviewed this encounter. An After Visit Summary was printed and given to the patient.       Sandi Crosby, PA-C 03/25/24 1554

## 2024-03-25 NOTE — ED Triage Notes (Signed)
 Patient to Urgent Care with complaints of productive cough/ SHOB/ nasal congestion/ low energy. Denies any fevers.  Reports symptoms started 6-7 days ago.   Taking Mucinex.

## 2024-03-25 NOTE — Discharge Instructions (Addendum)
 Follow up with your Physician for recheck.  Return if any problems.
